# Patient Record
Sex: Male | Born: 1960 | Race: White | Hispanic: No | Smoking: Current every day smoker
Health system: Southern US, Community
[De-identification: ages and names within clinical notes are randomized; demographics above are authoritative.]

---

## 2013-04-04 ENCOUNTER — Emergency Department (HOSPITAL_COMMUNITY)
Admission: EM | Admit: 2013-04-04 | Discharge: 2013-04-04 | Disposition: A | Discharge status: Home / Self Care | Payer: Self-pay | Attending: Emergency Medicine | Admitting: Emergency Medicine

## 2013-04-04 ENCOUNTER — Encounter (HOSPITAL_COMMUNITY): Payer: Self-pay | Admitting: Emergency Medicine

## 2013-04-04 ENCOUNTER — Emergency Department (HOSPITAL_COMMUNITY): Payer: Self-pay

## 2013-04-04 DIAGNOSIS — Y9389 Activity, other specified: Secondary | ICD-10-CM | POA: Insufficient documentation

## 2013-04-04 DIAGNOSIS — F17200 Nicotine dependence, unspecified, uncomplicated: Secondary | ICD-10-CM | POA: Insufficient documentation

## 2013-04-04 DIAGNOSIS — Y9289 Other specified places as the place of occurrence of the external cause: Secondary | ICD-10-CM | POA: Insufficient documentation

## 2013-04-04 DIAGNOSIS — M7500 Adhesive capsulitis of unspecified shoulder: Secondary | ICD-10-CM | POA: Insufficient documentation

## 2013-04-04 DIAGNOSIS — S46909A Unspecified injury of unspecified muscle, fascia and tendon at shoulder and upper arm level, unspecified arm, initial encounter: Secondary | ICD-10-CM | POA: Insufficient documentation

## 2013-04-04 DIAGNOSIS — M25511 Pain in right shoulder: Secondary | ICD-10-CM

## 2013-04-04 DIAGNOSIS — M7501 Adhesive capsulitis of right shoulder: Secondary | ICD-10-CM

## 2013-04-04 DIAGNOSIS — S4980XA Other specified injuries of shoulder and upper arm, unspecified arm, initial encounter: Secondary | ICD-10-CM | POA: Insufficient documentation

## 2013-04-04 DIAGNOSIS — X500XXA Overexertion from strenuous movement or load, initial encounter: Secondary | ICD-10-CM | POA: Insufficient documentation

## 2013-04-04 MED ORDER — OXYCODONE-ACETAMINOPHEN 5-325 MG PO TABS
2.0000 | ORAL_TABLET | Freq: Once | ORAL | Status: AC
  Administered 2013-04-04: 2 via ORAL
  Filled 2013-04-04: qty 2

## 2013-04-04 MED ORDER — OXYCODONE-ACETAMINOPHEN 5-325 MG PO TABS: 1.0000 | ORAL_TABLET | Freq: Three times a day (TID) | ORAL | Status: AC | PRN

## 2013-04-04 MED ORDER — OXYCODONE-ACETAMINOPHEN 5-325 MG PO TABS
1.0000 | ORAL_TABLET | Freq: Once | ORAL | Status: AC
  Administered 2013-04-04: 1 via ORAL
  Filled 2013-04-04: qty 1

## 2013-04-04 NOTE — ED Notes (Signed)
Waiting for driver

## 2013-04-04 NOTE — ED Notes (Signed)
Pt moved to lobby to wait on his ride. Report given to [place] who has d/c instructions and Rx.

## 2013-04-04 NOTE — ED Notes (Signed)
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

## 2013-04-04 NOTE — ED Provider Notes (Signed)
CSN: [id number]     Arrival date & time 04/04/13  0865 History  This chart was scribed for non-physician practitioner, Raymon Mutton, PA-C working with Raeford Razor, MD by Greggory Stallion, ED scribe. This patient was seen in room TR09C/TR09C and the patient's care was started at 10:09 AM.   Chief Complaint  Patient presents with  . Arm Injury   The history is provided by the patient. No language interpreter was used.   HPI Comments: Nicholas Klein is a 52 y.o. 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 He states he heard a "pop" at the time of injury and states he told his boss about it but was never seen for it. Pt had sudden onset, constant aching shoulder pain at the time but states the pain has worsened since then. States the pain radiates to his elbow. Motion worsens the pain. He has taken ibuprofen and iced the joint with no relief. Denies new injury or fall. Denies numbness, tingling, weakness, neck pain, back pain. Smokes cigarettes daily.    History reviewed. No pertinent past medical history. History reviewed. No pertinent past surgical history. History reviewed. No pertinent family history. History  Substance Use Topics  . Smoking status: Current Every Day Smoker  . Smokeless tobacco: Not on file  . Alcohol Use: No    Review of Systems  Musculoskeletal: Positive for arthralgias and myalgias. Negative for back pain and neck pain.  Neurological: Negative for weakness and numbness.  All other systems reviewed and are negative.    Allergies  Aspirin  Home Medications   Current Outpatient Rx  Name  Route  Sig  Dispense  Refill  . ibuprofen (ADVIL,MOTRIN) 200 MG tablet   Oral   Take 200 mg by mouth every 6 (six) hours as needed for moderate pain.         Marland Kitchen oxyCODONE-acetaminophen (PERCOCET/ROXICET) 5-325 MG per tablet   Oral   Take 1 tablet by mouth every 8 (eight)  hours as needed for severe pain.   11 tablet   0     BP 122/70  Pulse 73  Temp(Src) 97.2 F (36.2 C) (Oral)  Resp 16  Ht 5\' 9"  (1.753 m)  Wt 140 lb (63.504 kg)  BMI 20.67 kg/m2  SpO2 97%  Physical Exam  Nursing note and vitals reviewed. Constitutional: He is oriented to person, place, and time. He appears well-developed and well-nourished. No distress.  HENT:  Head: Normocephalic and atraumatic.  Eyes: EOM are normal.  Neck: Neck supple. No tracheal deviation present.  Cardiovascular: Normal rate, regular rhythm and normal heart sounds.  Exam reveals no gallop and no friction rub.   No murmur heard. Pulses:      Radial pulses are 2+ on the right side, and 2+ on the left side.  Pulmonary/Chest: Effort normal and breath sounds normal. No respiratory distress. He has no wheezes. He has no rales.  Musculoskeletal: Normal range of motion. He exhibits tenderness.       Arms: Negative tenting to the clavicles. Negative swelling, inflammation, erythema, sunken in appearence noted to the right shoulder. No deformities. Decreased ROM to shoulder secondary to pain. When provider was lifting right arm, pt tensed up dramatically resisting further abduction or any motion to the right shoulder. Discomfort upon palpation to anterior and posterior aspect of right shoulder. Full ROM to right elbow, right wrist and digits of right hand.   Lymphadenopathy:  He has no cervical adenopathy.  Neurological: He is alert and oriented to person, place, and time. He exhibits normal muscle tone. Coordination normal.  Strength 5+/5+ to upper extremities bilaterally with resistance applied, equal distribution noted Sensation intact with differentiation to sharp and dull touch to upper extremities bilaterally.   Skin: Skin is warm and dry.  Psychiatric: He has a normal mood and affect. His behavior is normal.    ED Course  Procedures (including critical care time)  DIAGNOSTIC STUDIES: Oxygen Saturation  is 97% on RA, normal by my interpretation.    COORDINATION OF CARE: 10:20 AM-Discussed treatment plan which includes xray with pt at bedside and pt agreed to plan.   Dg Shoulder Right  04/04/2013   CLINICAL DATA:  Lifting injury with right shoulder pain.  EXAM: RIGHT SHOULDER - 2+ VIEW  COMPARISON:  None.  FINDINGS: There is no fracture or acute osseous abnormality identified. The patient was unable to perform an axillary view to definitively assess location. On the scapular Y-view, the humeral head does appear located. Type 2 acromion. No significant AC joint or glenohumeral osteoarthritis.  IMPRESSION: Negative.   Electronically Signed   By: Andreas Newport M.D.   On: 04/04/2013 10:41    Labs Review Labs Reviewed - No data to display Imaging Review Dg Shoulder Right  04/04/2013   CLINICAL DATA:  Lifting injury with right shoulder pain.  EXAM: RIGHT SHOULDER - 2+ VIEW  COMPARISON:  None.  FINDINGS: There is no fracture or acute osseous abnormality identified. The patient was unable to perform an axillary view to definitively assess location. On the scapular Y-view, the humeral head does appear located. Type 2 acromion. No significant AC joint or glenohumeral osteoarthritis.  IMPRESSION: Negative.   Electronically Signed   By: Andreas Newport M.D.   On: 04/04/2013 10:41    EKG Interpretation   None       MDM   1. Adhesive capsulitis, right   2. Right shoulder pain     Medications  oxyCODONE-acetaminophen (PERCOCET/ROXICET) 5-325 MG per tablet 2 tablet (2 tablets Oral Given 04/04/13 1049)  oxyCODONE-acetaminophen (PERCOCET/ROXICET) 5-325 MG per tablet 1 tablet (1 tablet Oral Given 04/04/13 1112)   Filed Vitals:   04/04/13 0906 04/04/13 0907  BP:  122/70  Pulse:  73  Temp:  97.2 F (36.2 C)  TempSrc:  Oral  Resp:  16  Height: 5\' 9"  (1.753 m) 5\' 9"  (1.753 m)  Weight: 140 lb (63.504 kg) 140 lb (63.504 kg)  SpO2:  97%   I personally performed the services described in this  documentation, which was scribed in my presence. The recorded information has been reviewed and is accurate.  Patient presenting to emergency department with right shoulder pain that has been ongoing for the past 6 months. Patient reports he was lifting garbage and felt a "pop." Patient reports he presents to the emergency department today due to worsening pain. Patient states he's been using ibuprofen and massage with his hand. Denied following up with specialists. Alert and oriented. GCS 15. Lungs clear to auscultation bilaterally. Heart rate and rhythm normal. Radial pulses 2+ bilaterally. Negative swelling, erythema, inflammation, sunken in appearance or deformities noted to the right shoulder. Pain upon palpation to the anterior posterior aspect of the right shoulder. Decreased flexion, extension, abduction, adduction secondary to pain. Strength intact. Sensation intact with differentiation to sharp and dull touch. Patient neurovascularly intact. Patient stable, afebrile. Doubt dislocation. Doubt rotator cuff injury. Suspicion to be adhesive capsulitis. Patient  placed in sling immobilizer. Small dose of pain medications administered for comfort-discussed course, precautions, disposal technique. Referred patient to orthopedics for further evaluation. Discussed with patient to rest, ice. Discussed with patient to closely monitor symptoms and if symptoms are to worsen or change report back to emergency department-strict return instructions given. Patient agreed to plan of care, understood, all questions answered.  Raymon Mutton, PA-C 04/05/13 1015

## 2013-04-04 NOTE — ED Notes (Signed)
Dropped one Percocet when given the first time. Had to reorder, second Percocet given at this time.

## 2013-04-12 NOTE — ED Provider Notes (Signed)
Medical screening examination/treatment/procedure(s) were performed by non-physician practitioner and as supervising physician I was immediately available for consultation/collaboration.  EKG Interpretation   None        Raeford Razor, MD 04/12/13 [phone]

## 2013-04-19 ENCOUNTER — Other Ambulatory Visit (HOSPITAL_COMMUNITY): Payer: Self-pay | Admitting: Orthopedic Surgery

## 2013-04-19 DIAGNOSIS — M25511 Pain in right shoulder: Secondary | ICD-10-CM

## 2013-05-01 ENCOUNTER — Ambulatory Visit (HOSPITAL_COMMUNITY): Admission: RE | Admit: 2013-05-01 | Payer: Self-pay | Source: Ambulatory Visit

## 2015-03-06 IMAGING — CR DG SHOULDER 2+V*R*
2 series · 2 of 2 positions shown · non-contrast
Comparison: None.

CLINICAL DATA: Lifting injury with right shoulder pain.

EXAM:
RIGHT SHOULDER - 2+ VIEW

[w shoulder ap internal righ]
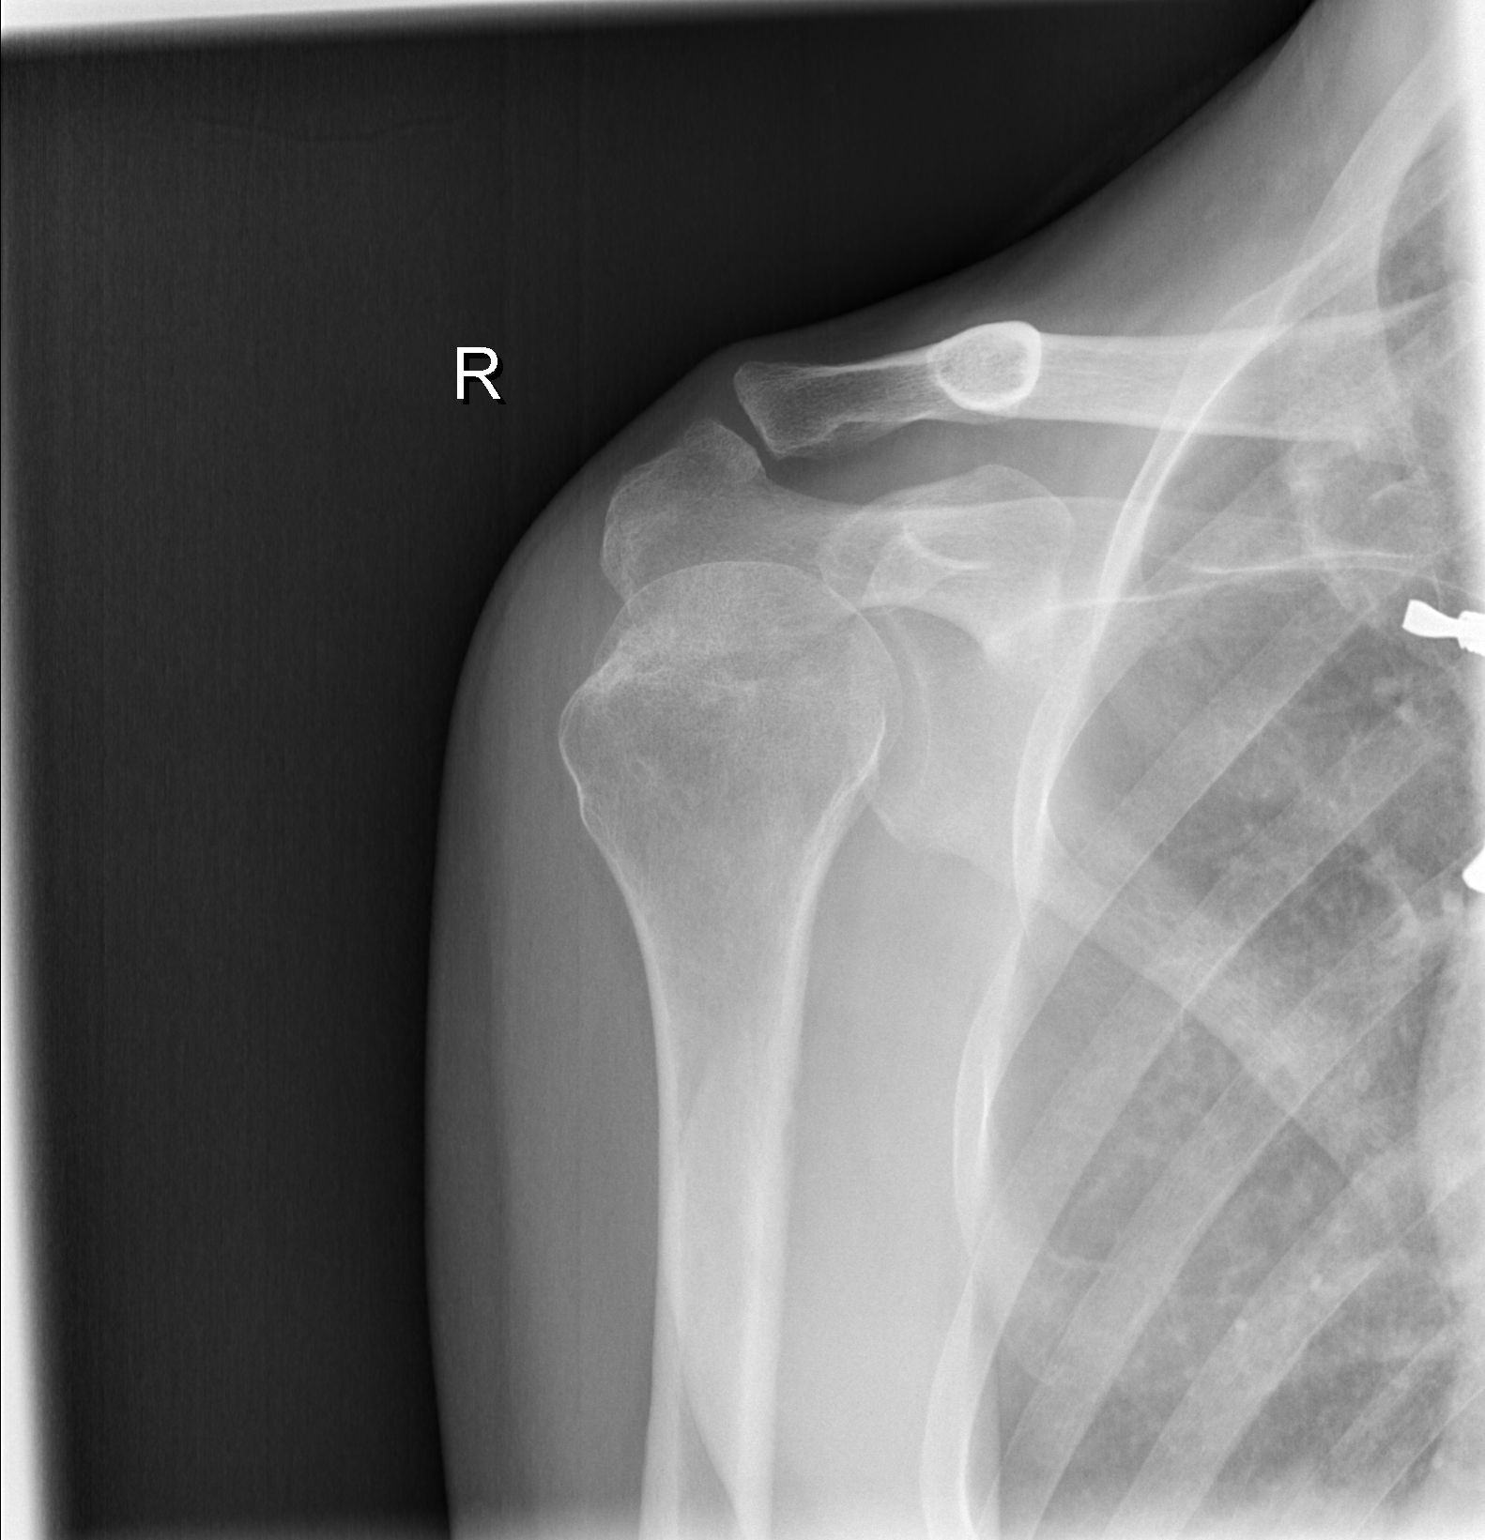

[w shoulder ap external righ]
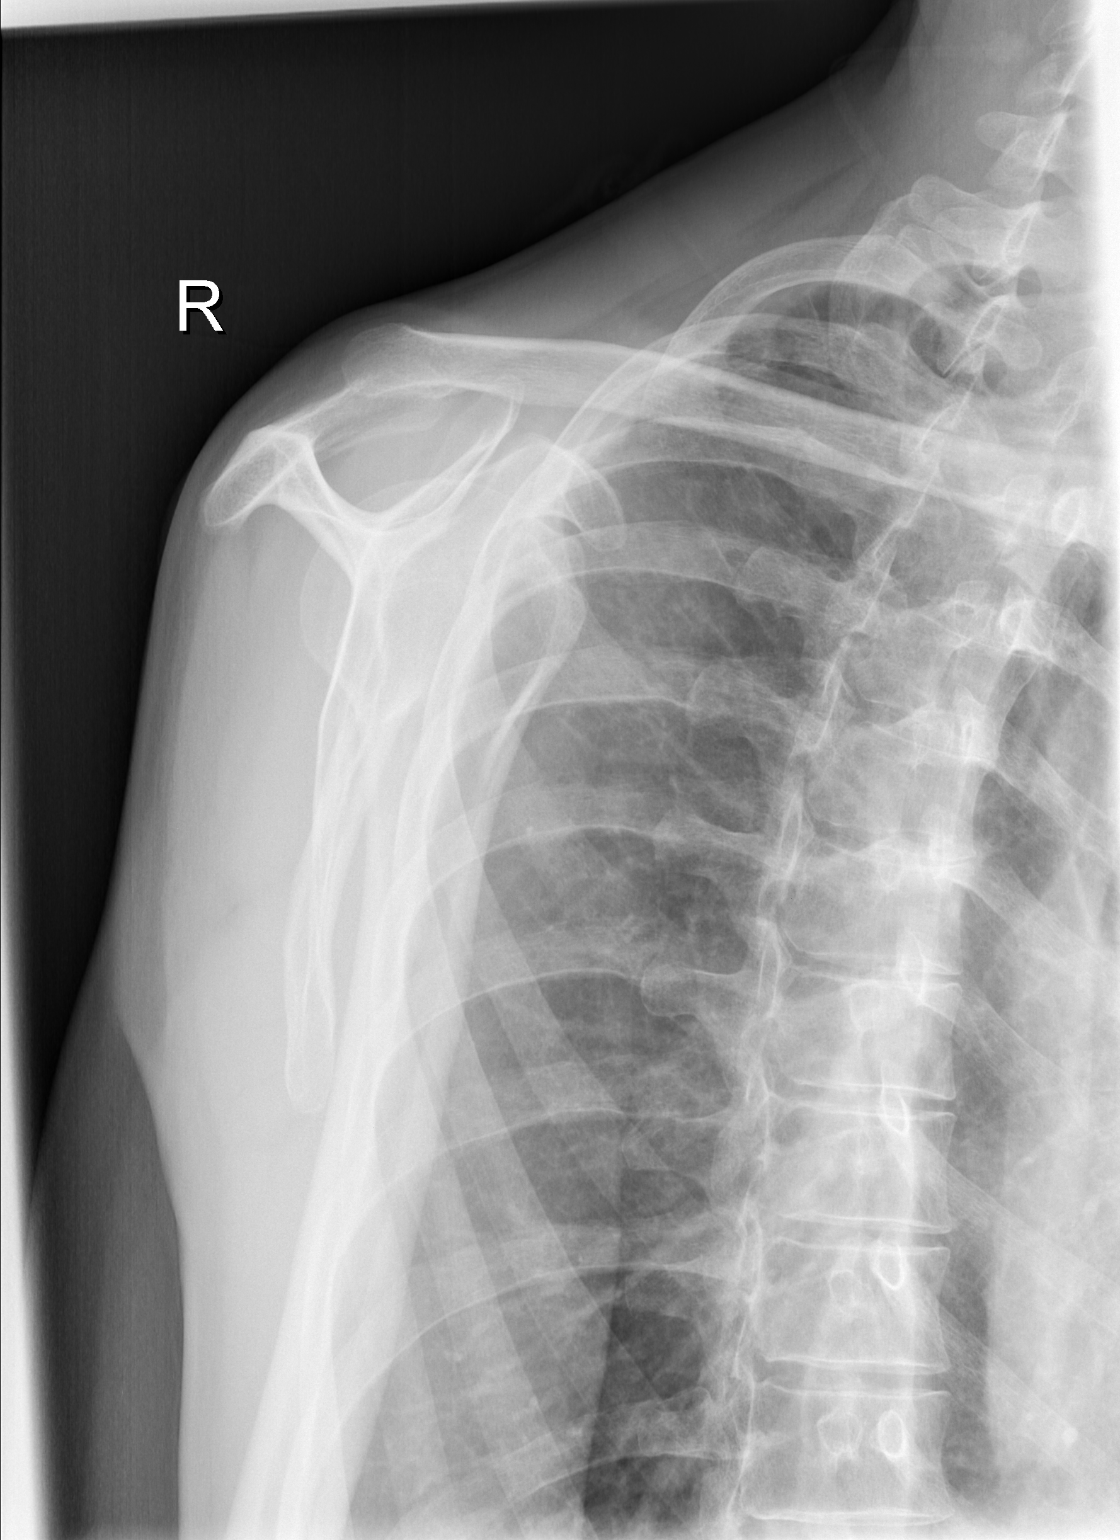

[2 of 2 positions shown; findings below may reference images not displayed]

FINDINGS: There is no fracture or acute osseous abnormality identified. The
patient was unable to perform an axillary view to definitively
assess location. On the scapular Y-view, the humeral head does
appear located. Type 2 acromion. No significant AC joint or
glenohumeral osteoarthritis.
IMPRESSION: Negative.
